# Patient Record
Sex: Female | Born: 2000 | Race: Black or African American | Hispanic: No | Marital: Single | Smoking: Former smoker
Health system: Southern US, Community
[De-identification: ages and names within clinical notes are randomized; demographics above are authoritative.]

## PROBLEM LIST (undated history)

## (undated) ENCOUNTER — Ambulatory Visit (HOSPITAL_COMMUNITY): Discharge status: Absent without leave | Payer: Self-pay

## (undated) DIAGNOSIS — J36 Peritonsillar abscess: Secondary | ICD-10-CM

## (undated) HISTORY — PX: NO PAST SURGERIES: SHX2092

## (undated) HISTORY — PX: TONSILLECTOMY: SUR1361

---
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
## 2019-01-05 ENCOUNTER — Encounter (HOSPITAL_COMMUNITY): Payer: Self-pay | Admitting: Emergency Medicine

## 2019-01-05 ENCOUNTER — Other Ambulatory Visit: Payer: Self-pay

## 2019-01-05 ENCOUNTER — Emergency Department (HOSPITAL_COMMUNITY)
Admission: EM | Admit: 2019-01-05 | Discharge: 2019-01-05 | Disposition: A | Discharge status: Home / Self Care | Payer: Federal, State, Local not specified - PPO | Attending: Emergency Medicine | Admitting: Emergency Medicine

## 2019-01-05 DIAGNOSIS — J039 Acute tonsillitis, unspecified: Secondary | ICD-10-CM

## 2019-01-05 DIAGNOSIS — J029 Acute pharyngitis, unspecified: Secondary | ICD-10-CM | POA: Diagnosis present

## 2019-01-05 LAB — GROUP A STREP BY PCR: Group A Strep by PCR: NOT DETECTED

## 2019-01-05 MED ORDER — PREDNISOLONE SODIUM PHOSPHATE 15 MG/5ML PO SOLN
60.0000 mg | Freq: Once | ORAL | Status: AC
  Administered 2019-01-05: 60 mg via ORAL
  Filled 2019-01-05: qty 4

## 2019-01-05 MED ORDER — AMOXICILLIN 500 MG PO CAPS: 1000.0000 mg | ORAL_CAPSULE | Freq: Three times a day (TID) | ORAL | 0 refills | Status: AC

## 2019-01-05 MED ORDER — LIDOCAINE VISCOUS HCL 2 % MT SOLN
15.0000 mL | Freq: Once | OROMUCOSAL | Status: AC
  Administered 2019-01-05: 22:00:00 15 mL via OROMUCOSAL
  Filled 2019-01-05: qty 15

## 2019-01-05 MED ORDER — LIDOCAINE VISCOUS HCL 2 % MT SOLN: 15.0000 mL | OROMUCOSAL | 0 refills | Status: AC | PRN

## 2019-01-05 MED ORDER — PREDNISONE 10 MG PO TABS: 40.0000 mg | ORAL_TABLET | Freq: Every day | ORAL | 0 refills | Status: AC

## 2019-01-05 MED ORDER — AMOXICILLIN 500 MG PO CAPS
1000.0000 mg | ORAL_CAPSULE | Freq: Once | ORAL | Status: AC
  Administered 2019-01-05: 22:00:00 1000 mg via ORAL
  Filled 2019-01-05: qty 2

## 2019-01-05 MED ORDER — ACETAMINOPHEN 325 MG PO TABS
650.0000 mg | ORAL_TABLET | Freq: Once | ORAL | Status: AC | PRN
  Administered 2019-01-05: 19:00:00 650 mg via ORAL
  Filled 2019-01-05: qty 2

## 2019-01-05 NOTE — Discharge Instructions (Signed)
You are seen today for sore throat.  It is.  She has very swollen tonsils.  You are given some medication for the pain, the swelling and infection.  If you are no better in 2 days please be reevaluated.  If you are worse please come sooner to the emergency department.  Drink plenty of liquids.  Take Tylenol for fever.

## 2019-01-05 NOTE — ED Provider Notes (Signed)
[facility]-EMERGENCY DEPT Provider Note   CSN: [id number] Arrival date & time: 01/05/19  1738     History   Chief Complaint Chief Complaint  Patient presents with  . Sore Throat    HPI Dawn Cannon is a 18 y.o. female.     18 year old female with no past medical history presenting to the emergency department for sore throat which began yesterday.  Reports significant pain in her throat.  She is swallowing liquids and solids but with discomfort.  Denies any nausea, vomiting, chest pain, shortness of breath.  Denies any other URI symptoms.  Has not tried anything for relief.     History reviewed. No pertinent past medical history.  There are no active problems to display for this patient.   History reviewed. No pertinent surgical history.   OB History   No obstetric history on file.      Home Medications    Prior to Admission medications   Not on File    Family History No family history on file.  Social History Social History   Tobacco Use  . Smoking status: Not on file  Substance Use Topics  . Alcohol use: Not on file  . Drug use: Not on file     Allergies   Patient has no known allergies.   Review of Systems Review of Systems  Constitutional: Positive for fever. Negative for chills, diaphoresis, fatigue and unexpected weight change.  HENT: Positive for sore throat and trouble swallowing (pain with swallowing). Negative for congestion, drooling, ear pain, nosebleeds, postnasal drip, rhinorrhea and sinus pain.   Respiratory: Negative for cough and shortness of breath.   Cardiovascular: Negative for chest pain.  Gastrointestinal: Negative for nausea and vomiting.  Genitourinary: Negative for dysuria.  Musculoskeletal: Negative for back pain.  Skin: Negative for rash.  Allergic/Immunologic: Negative for immunocompromised state.  Neurological: Negative for dizziness, light-headedness and headaches.     Physical Exam  Updated Vital Signs BP 140/80 (BP Location: Left Arm)   Pulse 88   Temp (!) 102.3 F (39.1 C) (Oral)   Resp 20   SpO2 100%   Physical Exam Vitals signs and nursing note reviewed.  Constitutional:      Appearance: She is well-developed.  HENT:     Nose: No congestion or rhinorrhea.     Mouth/Throat:     Mouth: Mucous membranes are moist.     Pharynx: Oropharynx is clear. No uvula swelling.     Tonsils: Tonsillar exudate present. No tonsillar abscesses. 3+ on the right. 3+ on the left.  Eyes:     Extraocular Movements:     Right eye: Normal extraocular motion.     Left eye: Normal extraocular motion.     Conjunctiva/sclera: Conjunctivae normal.  Neck:     Musculoskeletal: Normal range of motion and neck supple.     Thyroid: No thyromegaly.  Cardiovascular:     Rate and Rhythm: Normal rate and regular rhythm.  Pulmonary:     Effort: Pulmonary effort is normal.     Breath sounds: Normal breath sounds.  Lymphadenopathy:     Cervical: Cervical adenopathy (Bilateral anterior cervical tenderness to palpation) present.  Skin:    General: Skin is warm.  Neurological:     Mental Status: She is alert.  Psychiatric:        Mood and Affect: Mood normal.      ED Treatments / Results  Labs (all labs ordered are listed, but only abnormal results are  displayed) Labs Reviewed  GROUP A STREP BY PCR    EKG None  Radiology No results found.  Procedures Procedures (including critical care time)  Medications Ordered in ED Medications  lidocaine (XYLOCAINE) 2 % viscous mouth solution 15 mL (has no administration in time range)  prednisoLONE (ORAPRED) 15 MG/5ML solution 60 mg (has no administration in time range)  amoxicillin (AMOXIL) capsule 1,000 mg (has no administration in time range)  acetaminophen (TYLENOL) tablet 650 mg (650 mg Oral Given 01/05/19 1901)     Initial Impression / Assessment and Plan / ED Course  I have reviewed the triage vital signs and the nursing  notes.  Pertinent labs & imaging results that were available during my care of the patient were reviewed by me and considered in my medical decision making (see chart for details).  Clinical Course as of Jan 05 2211  Tue Jan 05, 2019  2141 Patient here with sore throat starting yesterday.  Has significantly swollen tonsils with tonsillar exudate.  No peritonsillar or tonsillar abscess.  Febrile but strep test negative.  Given her symptoms and appearance of her tonsils we will treat her for strep tonsillitis.  Patient given Tylenol, amoxicillin, viscous lidocaine, Orapred.  She was advised on very strict return precautions and that she needs to return to the emergency department if she has any new or worsening symptoms or is not proving after 2 days   [KM]    Clinical Course User Index [KM] Alveria Apley, PA-C       Based on review of vitals, medical screening exam, lab work and/or imaging, there does not appear to be an acute, emergent etiology for the patient's symptoms. Counseled pt on good return precautions and encouraged both PCP and ED follow-up as needed.  Prior to discharge, I also discussed incidental imaging findings with patient in detail and advised appropriate, recommended follow-up in detail.  Clinical Impression: 1. Tonsillitis     Disposition: Discharge  Prior to providing a prescription for a controlled substance, I independently reviewed the patient's recent prescription history on the [place]. The patient had no recent or regular prescriptions and was deemed appropriate for a brief, less than 3 day prescription of narcotic for acute analgesia.  This note was prepared with assistance of Systems analyst. Occasional wrong-word or sound-a-like substitutions may have occurred due to the inherent limitations of voice recognition software.   Final Clinical Impressions(s) / ED Diagnoses   Final diagnoses:   None    ED Discharge Orders    None       Kristine Royal 01/05/19 2227    Lacretia Leigh, MD 01/07/19 1341

## 2019-01-05 NOTE — ED Triage Notes (Signed)
Pt c/o sore throat and trouble speaking due to pain that started yesterday.

## 2019-04-12 ENCOUNTER — Emergency Department (HOSPITAL_COMMUNITY): Payer: Federal, State, Local not specified - PPO

## 2019-04-12 ENCOUNTER — Encounter (HOSPITAL_COMMUNITY): Payer: Self-pay

## 2019-04-12 ENCOUNTER — Emergency Department (HOSPITAL_COMMUNITY)
Admission: EM | Admit: 2019-04-12 | Discharge: 2019-04-12 | Disposition: A | Discharge status: Home / Self Care | Payer: Federal, State, Local not specified - PPO | Attending: Emergency Medicine | Admitting: Emergency Medicine

## 2019-04-12 DIAGNOSIS — J029 Acute pharyngitis, unspecified: Secondary | ICD-10-CM | POA: Diagnosis present

## 2019-04-12 DIAGNOSIS — Z20822 Contact with and (suspected) exposure to covid-19: Secondary | ICD-10-CM | POA: Diagnosis not present

## 2019-04-12 DIAGNOSIS — J36 Peritonsillar abscess: Secondary | ICD-10-CM | POA: Insufficient documentation

## 2019-04-12 DIAGNOSIS — Z79899 Other long term (current) drug therapy: Secondary | ICD-10-CM | POA: Diagnosis not present

## 2019-04-12 LAB — BASIC METABOLIC PANEL
Anion gap: 11 (ref 5–15)
BUN: 9 mg/dL (ref 6–20)
CO2: 23 mmol/L (ref 22–32)
Calcium: 9.4 mg/dL (ref 8.9–10.3)
Chloride: 104 mmol/L (ref 98–111)
Creatinine, Ser: 0.95 mg/dL (ref 0.44–1.00)
GFR calc Af Amer: >60 mL/min (ref >60)
GFR calc non Af Amer: >60 mL/min (ref >60)
Glucose, Bld: 114 mg/dL — ABNORMAL HIGH (ref 70–99)
Potassium: 4 mmol/L (ref 3.5–5.1)
Sodium: 138 mmol/L (ref 135–145)

## 2019-04-12 LAB — CBC WITH DIFFERENTIAL/PLATELET
Abs Immature Granulocytes: 0.04 10*3/uL (ref 0.00–0.07)
Basophils Absolute: 0 10*3/uL (ref 0.0–0.1)
Basophils Relative: 0 %
Eosinophils Absolute: 0 10*3/uL (ref 0.0–0.5)
Eosinophils Relative: 0 %
HCT: 40.1 % (ref 36.0–46.0)
Hemoglobin: 13.6 g/dL (ref 12.0–15.0)
Immature Granulocytes: 0 %
Lymphocytes Relative: 8 %
Lymphs Abs: 0.9 10*3/uL (ref 0.7–4.0)
MCH: 28.5 pg (ref 26.0–34.0)
MCHC: 33.9 g/dL (ref 30.0–36.0)
MCV: 84.1 fL (ref 80.0–100.0)
Monocytes Absolute: 1.2 10*3/uL — ABNORMAL HIGH (ref 0.1–1.0)
Monocytes Relative: 10 %
Neutro Abs: 10 10*3/uL — ABNORMAL HIGH (ref 1.7–7.7)
Neutrophils Relative %: 82 %
Platelets: 231 10*3/uL (ref 150–400)
RBC: 4.77 MIL/uL (ref 3.87–5.11)
RDW: 14.7 % (ref 11.5–15.5)
WBC: 12.2 10*3/uL — ABNORMAL HIGH (ref 4.0–10.5)
nRBC: 0 % (ref 0.0–0.2)

## 2019-04-12 LAB — MONONUCLEOSIS SCREEN: Mono Screen: NEGATIVE

## 2019-04-12 LAB — POC SARS CORONAVIRUS 2 AG -  ED: SARS Coronavirus 2 Ag: NEGATIVE

## 2019-04-12 LAB — I-STAT BETA HCG BLOOD, ED (MC, WL, AP ONLY): I-stat hCG, quantitative: <5 m[IU]/mL (ref <5)

## 2019-04-12 LAB — GROUP A STREP BY PCR: Group A Strep by PCR: NOT DETECTED

## 2019-04-12 MED ORDER — CLINDAMYCIN HCL 300 MG PO CAPS: 300.0000 mg | ORAL_CAPSULE | Freq: Three times a day (TID) | ORAL | 0 refills | Status: AC

## 2019-04-12 MED ORDER — IOHEXOL 300 MG/ML  SOLN
75.0000 mL | Freq: Once | INTRAMUSCULAR | Status: AC | PRN
  Administered 2019-04-12: 20:00:00 75 mL via INTRAVENOUS

## 2019-04-12 MED ORDER — CLINDAMYCIN PHOSPHATE 600 MG/50ML IV SOLN
600.0000 mg | Freq: Once | INTRAVENOUS | Status: AC
  Administered 2019-04-12: 18:00:00 600 mg via INTRAVENOUS
  Filled 2019-04-12: qty 50

## 2019-04-12 MED ORDER — ACETAMINOPHEN 325 MG PO TABS
650.0000 mg | ORAL_TABLET | Freq: Once | ORAL | Status: AC
  Administered 2019-04-12: 23:00:00 650 mg via ORAL

## 2019-04-12 MED ORDER — DEXAMETHASONE SODIUM PHOSPHATE 10 MG/ML IJ SOLN
10.0000 mg | Freq: Once | INTRAMUSCULAR | Status: AC
  Administered 2019-04-12: 18:00:00 10 mg via INTRAVENOUS
  Filled 2019-04-12: qty 1

## 2019-04-12 NOTE — ED Notes (Signed)
Mother updated about POC

## 2019-04-12 NOTE — ED Notes (Signed)
Pt refusing second COVID test

## 2019-04-12 NOTE — Consult Note (Signed)
Reason for Consult: Evaluate patient with right peritonsillar abscess Referring Physician: ED PA  Dawn Cannon is an 19 y.o. female.  HPI: Whose had a persistent sore throat for over a week.  She was seen at [facility] ED about a week ago and was treated with penicillin.  She has had a persistent sore throat that is worse on the right side with pain in the right ear.  CT scan was performed and showed a small 1 to 1/2 cm right peritonsillar abscess.  She apparently had similar symptoms about a month ago. She has received IV clindamycin as well as steroids in the ED. Strep test was negative. Covid test is pending.  History reviewed. No pertinent past medical history.  History reviewed. No pertinent surgical history.  Social History:  has no history on file for tobacco, alcohol, and drug.  Allergies:  Allergies  Allergen Reactions  . Flour Shortness Of Breath  . Watermelon [Citrullus Vulgaris] Swelling, Rash and Other (See Comments)    No breathing impairment (rash/swelling- face and mouth only)    Medications:   Results for orders placed or performed during the hospital encounter of 04/12/19 (from the past 48 hour(s))  CBC with Differential     Status: Abnormal   Collection Time: 04/12/19  5:28 PM  Result Value Ref Range   WBC 12.2 (H) 4.0 - 10.5 K/uL   RBC 4.77 3.87 - 5.11 MIL/uL   Hemoglobin 13.6 12.0 - 15.0 g/dL   HCT 40.1 36.0 - 46.0 %   MCV 84.1 80.0 - 100.0 fL   MCH 28.5 26.0 - 34.0 pg   MCHC 33.9 30.0 - 36.0 g/dL   RDW 14.7 11.5 - 15.5 %   Platelets 231 150 - 400 K/uL   nRBC 0.0 0.0 - 0.2 %   Neutrophils Relative % 82 %   Neutro Abs 10.0 (H) 1.7 - 7.7 K/uL   Lymphocytes Relative 8 %   Lymphs Abs 0.9 0.7 - 4.0 K/uL   Monocytes Relative 10 %   Monocytes Absolute 1.2 (H) 0.1 - 1.0 K/uL   Eosinophils Relative 0 %   Eosinophils Absolute 0.0 0.0 - 0.5 K/uL   Basophils Relative 0 %   Basophils Absolute 0.0 0.0 - 0.1 K/uL   Immature Granulocytes 0 %   Abs Immature  Granulocytes 0.04 0.00 - 0.07 K/uL    Comment: Performed at [facility], [address], [place], [place] [address]  Basic metabolic panel     Status: Abnormal   Collection Time: 04/12/19  5:28 PM  Result Value Ref Range   Sodium 138 135 - 145 mmol/L   Potassium 4.0 3.5 - 5.1 mmol/L   Chloride 104 98 - 111 mmol/L   CO2 23 22 - 32 mmol/L   Glucose, Bld 114 (H) 70 - 99 mg/dL   BUN 9 6 - 20 mg/dL   Creatinine, Ser 0.95 0.44 - 1.00 mg/dL   Calcium 9.4 8.9 - 10.3 mg/dL   GFR calc non Af Amer >60 >60 mL/min   GFR calc Af Amer >60 >60 mL/min   Anion gap 11 5 - 15    Comment: Performed at [facility] [address] [place],  [address]  Group A Strep by PCR     Status: None   Collection Time: 04/12/19  5:41 PM   Specimen: Throat; Sterile Swab  Result Value Ref Range   Group A Strep by PCR NOT DETECTED NOT DETECTED    Comment:  Performed at [facility], [place] [address], [place], [place] [address]  I-Stat Beta hCG blood, ED ([facility], [facility], [facility] only)     Status: None   Collection Time: 04/12/19  6:08 PM  Result Value Ref Range   I-stat hCG, quantitative <5.0 <5 mIU/mL   Comment 3            Comment:   GEST. AGE      CONC.  (mIU/mL)   <=1 WEEK        5 - 50     2 WEEKS       50 - 500     3 WEEKS       100 - 10,000     4 WEEKS     1,000 - 30,000        FEMALE AND NON-PREGNANT FEMALE:     LESS THAN 5 mIU/mL   Mononucleosis screen     Status: None   Collection Time: 04/12/19  6:16 PM  Result Value Ref Range   Mono Screen NEGATIVE NEGATIVE    Comment: Performed at [facility], [place] [address], [place], [place] [address]  POC SARS Coronavirus 2 Ag-ED - Nasal Swab (BD Veritor Kit)     Status: None   Collection Time: 04/12/19  6:28 PM  Result Value Ref Range   SARS Coronavirus 2 Ag NEGATIVE NEGATIVE    Comment: (NOTE) SARS-CoV-2 antigen NOT DETECTED.  Negative results are presumptive.  Negative results do not preclude SARS-CoV-2 infection and should not be used as  the sole basis for treatment or other patient management decisions, including infection  control decisions, particularly in the presence of clinical signs and  symptoms consistent with COVID-19, or in those who have been in contact with the virus.  Negative results must be combined with clinical observations, patient history, and epidemiological information. The expected result is Negative. Fact Sheet for Patients: PodPark.tn Fact Sheet for Healthcare Providers: GiftContent.is This test is not yet approved or cleared by the [place] FDA and  has been authorized for detection and/or diagnosis of SARS-CoV-2 by FDA under an Emergency Use Authorization (EUA).  This EUA will remain in effect (meaning this test can be used) for the duration of  the COVID-19 de claration under Section 564(b)(1) of the Act, 21 U.S.C. section 360bbb-3(b)(1), unless the authorization is terminated or revoked sooner.     CT Soft Tissue Neck W Contrast  Result Date: 04/12/2019 CLINICAL DATA:  Tonsillitis. The patient reports a sensation of her throat closing. Leukocytosis. EXAM: CT NECK WITH CONTRAST TECHNIQUE: Multidetector CT imaging of the neck was performed using the standard protocol following the bolus administration of intravenous contrast. CONTRAST:  50m OMNIPAQUE IOHEXOL 300 MG/ML  SOLN COMPARISON:  None. FINDINGS: Pharynx and larynx: There is asymmetric enlargement of the right palatine tonsil, and there is an associated peritonsillar fluid collection measuring 13 x 8 x 10 mm. Mild submucosal edema extends inferiorly. The airway remains patent. There is no retropharyngeal fluid collection. Salivary glands: No inflammation, mass, or stone. Thyroid: Unremarkable. Lymph nodes: No enlarged or suspicious lymph nodes in the neck. Vascular: Major vascular structures of the neck are patent. Limited intracranial: Unremarkable. Visualized orbits: Unremarkable.  Mastoids and visualized paranasal sinuses: Clear. Skeleton: No acute osseous abnormality or suspicious osseous lesion. Upper chest: Clear lung apices. Other: None. IMPRESSION: Acute tonsillitis with 13 mm right peritonsillar abscess. Electronically Signed   By: ALogan BoresM.D.   On: 04/12/2019 20:12  ROS: Otherwise negative   PE: Patient is alert in moderate pain discomfort on the right side of her right ear. She has no airway problems. Oral examination with headlight reveals right peritonsillar swelling with a soft left peritonsillar area.  She has moderate size 3+ tonsils bilaterally. Procedure: I&D of right peritonsillar abscess. Oropharynx was sprayed with Hurricaine for topical anesthesia. The right peritonsillar area was then injected with 5 cc of 1% Xylocaine with epinephrine for local anesthetic. Using a 18-gauge needle aspiration of the right peritonsillar area was performed in approximately 1 cc of purulence was obtained.  This was then opened up with a 15 blade and hemostat with a minimal further purulence obtained. She tolerated this well with minimal bleeding.  Assessment/Plan: Right peritonsillar abscess.  History of similar symptoms 1 month ago.  Agree with treatment with clindamycin 300 mg 3 times daily for 1 week. She will follow-up in my office next week for recheck and to possibly schedule tonsillectomy in the near future. She can call me if she has any further problems     [phone]  Dawn Cannon 04/12/2019, 10:07 PM

## 2019-04-12 NOTE — ED Notes (Signed)
Discharge instructions discussed with pt. Pt verbalized understanding. Pt stable and ambulatory. No signature pad available. 

## 2019-04-12 NOTE — ED Triage Notes (Signed)
Pt thinks she is having an allergic reaction to something at work, states she feels like her throat is closing. resp unlabored at this time, oxygen 100%.

## 2019-04-12 NOTE — ED Notes (Signed)
Dr. Ezzard Standing at bedside draining tonsil abcess

## 2019-04-12 NOTE — ED Provider Notes (Addendum)
[facility] EMERGENCY DEPARTMENT Provider Note   CSN: [id number] Arrival date & time: 04/12/19  1706     History Chief Complaint  Patient presents with  . Sore Throat    Dawn Cannon is a 19 y.o. female otherwise healthy no daily medication use presents today for sore throat.  Of note triage documentation shows concern for allergic reaction from something she ate at work and feels like her throat is closing.  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  She reports that this has persistently gotten worse over the last week.  She describes a bilateral scratchy burning sensation constant worsened with swallowing and without alleviating factors, she reports that she feels like her throat is swollen, she denies radiation of her pain and currently describes it as moderate in intensity.  She reports that she was seen at [facility] earlier this week and received an injection of intramuscular penicillin, she is unsure why she received this injection, she reports that it did not alleviate her symptoms.  She denies any history of fever/chills, headache, vision changes, drooling, voice change, chest pain/shortness of breath, cough, wheezing, abdominal pain, nausea/vomiting, diarrhea, rash or any additional concerns.  She denies history of previous allergies.  HPI     History reviewed. No pertinent past medical history.  There are no problems to display for this patient.   History reviewed. No pertinent surgical history.   OB History   No obstetric history on file.     No family history on file.  Social History   Tobacco Use  . Smoking status: Not on file  Substance Use Topics  . Alcohol use: Not on file  . Drug use: Not on file    Home Medications Prior to Admission medications     Medication Sig Start Date End Date Taking? Authorizing Provider  penicillin v potassium (VEETID) 250 MG tablet Take 500 mg by mouth 2 (two) times daily. FOR 10 DAYS 04/11/19  Yes [provider]  clindamycin (CLEOCIN) 300 MG capsule Take 1 capsule (300 mg total) by mouth 3 (three) times daily for 7 days. 04/12/19 04/19/19  Harlene Salts A, PA-C    Allergies    Flour and Watermelon [citrullus vulgaris]  Review of Systems   Review of Systems Ten systems are reviewed and are negative for acute change except as noted in the HPI  Physical Exam Updated Vital Signs BP 121/72   Pulse 68   Temp 98.1 F (36.7 C) (Oral)   Resp 16   SpO2 99%   Physical Exam Constitutional:      General: She is not in acute distress.    Appearance: Normal appearance. She is well-developed. She is not ill-appearing or diaphoretic.  HENT:     Head: Normocephalic and atraumatic.     Jaw: There is normal jaw occlusion. No trismus.     Right Ear: Tympanic membrane and external ear normal.     Left Ear: Tympanic membrane and external ear normal.     Nose: Nose normal.     Mouth/Throat:     Mouth: Mucous membranes are moist.     Pharynx: Uvula midline.     Tonsils: 3+ on the right. 2+ on the left.     Comments: The patient has normal phonation and is in  control of secretions. No stridor.  Midline uvula without edema. Soft palate rises symmetrically.  Bilateral tonsillar swelling right greater than left with mild erythema no exudates. Tongue protrusion is normal, floor of mouth is soft. No trismus. No creptius on neck palpation. No gingival erythema or fluctuance noted. Mucus membranes moist. No pallor noted. Eyes:     General: Vision grossly intact. Gaze aligned appropriately.     Extraocular Movements: Extraocular movements intact.     Conjunctiva/sclera: Conjunctivae normal.     Pupils: Pupils are equal, round, and reactive to light.  Neck:     Trachea: Trachea and phonation normal. No tracheal  tenderness or tracheal deviation.  Pulmonary:     Effort: Pulmonary effort is normal. No respiratory distress.  Abdominal:     General: There is no distension.     Palpations: Abdomen is soft.     Tenderness: There is no abdominal tenderness. There is no guarding or rebound.  Musculoskeletal:        General: Normal range of motion.     Cervical back: Full passive range of motion without pain, normal range of motion and neck supple.  Skin:    General: Skin is warm and dry.     Findings: No rash.  Neurological:     Mental Status: She is alert.     GCS: GCS eye subscore is 4. GCS verbal subscore is 5. GCS motor subscore is 6.     Comments: Speech is clear and goal oriented, follows commands Major Cranial nerves without deficit, no facial droop Moves extremities without ataxia, coordination intact  Psychiatric:        Behavior: Behavior normal.     ED Results / Procedures / Treatments   Labs (all labs ordered are listed, but only abnormal results are displayed) Labs Reviewed  CBC WITH DIFFERENTIAL/PLATELET - Abnormal; Notable for the following components:      Result Value   WBC 12.2 (*)    Neutro Abs 10.0 (*)    Monocytes Absolute 1.2 (*)    All other components within normal limits  BASIC METABOLIC PANEL - Abnormal; Notable for the following components:   Glucose, Bld 114 (*)    All other components within normal limits  GROUP A STREP BY PCR  SARS CORONAVIRUS 2 (TAT 6-24 HRS)  MONONUCLEOSIS SCREEN  I-STAT BETA HCG BLOOD, ED ([facility], [facility], [facility] ONLY)  POC SARS CORONAVIRUS 2 AG -  ED    EKG EKG Interpretation  Date/Time:  Monday April 12 2019 17:12:32 EST Ventricular Rate:  120 PR Interval:  126 QRS Duration: 82 QT Interval:  310 QTC Calculation: 438 R Axis:   98 Text Interpretation: Sinus tachycardia Rightward axis ST & T wave abnormality, consider inferior ischemia Abnormal ECG No previous ECGs available Confirmed by Vanetta Mulders [phone]) on 04/12/2019 8:09:03  PM   Radiology CT Soft Tissue Neck W Contrast  Result Date: 04/12/2019 CLINICAL DATA:  Tonsillitis. The patient reports a sensation of her throat closing. Leukocytosis. EXAM: CT NECK WITH CONTRAST TECHNIQUE: Multidetector CT imaging of the neck was performed using the standard protocol following the bolus administration of intravenous contrast. CONTRAST:  74mL OMNIPAQUE IOHEXOL 300 MG/ML  SOLN COMPARISON:  None. FINDINGS: Pharynx and larynx: There is asymmetric enlargement of the right palatine tonsil, and there is an associated peritonsillar fluid collection measuring 13 x 8 x 10 mm. Mild submucosal edema extends inferiorly. The airway remains patent. There is no retropharyngeal fluid collection. Salivary glands: No inflammation, mass, or stone.  Thyroid: Unremarkable. Lymph nodes: No enlarged or suspicious lymph nodes in the neck. Vascular: Major vascular structures of the neck are patent. Limited intracranial: Unremarkable. Visualized orbits: Unremarkable. Mastoids and visualized paranasal sinuses: Clear. Skeleton: No acute osseous abnormality or suspicious osseous lesion. Upper chest: Clear lung apices. Other: None. IMPRESSION: Acute tonsillitis with 13 mm right peritonsillar abscess. Electronically Signed   By: Logan Bores M.D.   On: 04/12/2019 20:12    Procedures Procedures (including critical care time)  Medications Ordered in ED Medications  dexamethasone (DECADRON) injection 10 mg (10 mg Intravenous Given 04/12/19 1814)  clindamycin (CLEOCIN) IVPB 600 mg (0 mg Intravenous Stopped 04/12/19 1922)  iohexol (OMNIPAQUE) 300 MG/ML solution 75 mL (75 mLs Intravenous Contrast Given 04/12/19 1954)    ED Course  I have reviewed the triage vital signs and the nursing notes.  Pertinent labs & imaging results that were available during my care of the patient were reviewed by me and considered in my medical decision making (see chart for details).  Clinical Course as of Apr 11 2244  Mon Apr 12, 2019   2048 Dr. Lucia Gaskins   [BM]  2238 Clindamycin 3 times daily x1 week    [BM]    Clinical Course User Index [BM] Gari Crown   MDM Rules/Calculators/A&P                     19 year old female checked in today for concern of throat swelling and possible allergic reaction ongoing for 1 week after being exposed to flour.  Further history and examination revealed the patient has tonsillitis today, it appears she was treated at [facility] for the same a few days ago and treated with intramuscular penicillin, I am unable to access their charts through EMR today.  On examination patient is comfortable well-appearing and in no acute distress.  She has markedly swollen tonsils right greater than left with mild erythema and without exudate.  Her airway is patent, she is controlling her secretions and reports her voice is normal.  She has mild bilateral cervical lymphadenopathy.  Heart regular rate and rhythm without murmur, lungs clear bilaterally abdomen is soft nontender without peritoneal signs there is no evidence of rash.  Suspect patient symptoms today secondary to tonsillitis, no evidence of allergic reaction.  There is concern as patient has ongoing symptoms despite intramuscular penicillin treatment, concern for possible development of peritonsillar abscess as right tonsil does appear larger compared to left.  Will give patient IV clindamycin, Decadron and obtain basic blood work and CT soft tissue neck. - Strep test negative Mono screen negative Beta-hCG negative Rapid Covid negative CBC with mild leukocytosis of 12.2 BMP glucose 114 otherwise within normal limits Patient has received Decadron and clindamycin.  On reassessment she is resting comfortably in bed no acute distress reports she feels "a lot better".  She is still waiting her CT scan at this time.  She remains stable with an intact airway. - CT soft tissue neck:    IMPRESSION:  Acute tonsillitis with 13 mm right  peritonsillar abscess  - 8:48 PM: Consult called to ENT specialist Dr. Lucia Gaskins who is on way to evaluate patient.  Case discussed with Dr. Rogene Houston. - Patient was seen and evaluated by ENT specialist Dr. Lucia Gaskins who performed incision and drainage of the right peritonsillar abscess.  Please see his documentation for details.  He recommends to discharge patient with clindamycin 300 mg 3 times daily for 1 week and she is to  follow-up in his office for recheck next week and plans for possible tonsillectomy. - On reevaluation patient resting comfortably in bed no acute distress states understanding of care plan and is agreeable for discharge.  She is aware that she is a pending COVID-19 test and she will follow-up on her MyChart account the next few days and discuss results with your primary care provider.  At this time there does not appear to be any evidence of an acute emergency medical condition and the patient appears stable for discharge with appropriate outpatient follow up. Diagnosis was discussed with patient who verbalizes understanding of care plan and is agreeable to discharge. I have discussed return precautions with patient who verbalizes understanding of return precautions. Patient encouraged to follow-up with their PCP and ENT. All questions answered.  Dawn Cannon was evaluated in Emergency Department on 04/12/2019 for the symptoms described in the history of present illness. She was evaluated in the context of the global COVID-19 pandemic, which necessitated consideration that the patient might be at risk for infection with the SARS-CoV-2 virus that causes COVID-19. Institutional protocols and algorithms that pertain to the evaluation of patients at risk for COVID-19 are in a state of rapid change based on information released by regulatory bodies including the CDC and federal and state organizations. These policies and algorithms were followed during the patient's care in the ED.  Note:  Portions of this report may have been transcribed using voice recognition software. Every effort was made to ensure accuracy; however, inadvertent computerized transcription errors may still be present. Final Clinical Impression(s) / ED Diagnoses Final diagnoses:  Peritonsillar abscess    Rx / DC Orders ED Discharge Orders         Ordered    clindamycin (CLEOCIN) 300 MG capsule  3 times daily     04/12/19 2245           Elizabeth Palau 04/12/19 2246    Bill Salinas, PA-C 04/12/19 2246    Vanetta Mulders, MD 04/17/19 [phone]

## 2019-04-12 NOTE — Discharge Instructions (Signed)
You have been diagnosed today with Peritonsillar Abscess.  At this time there does not appear to be the presence of an emergent medical condition, however there is always the potential for conditions to change. Please read and follow the below instructions.  Please return to the Emergency Department immediately for any new or worsening symptoms or if your symptoms return. Please be sure to follow up with your Primary Care Provider within one week regarding your visit today; please call their office to schedule an appointment even if you are feeling better for a follow-up visit. Please take the antibiotic clindamycin as prescribed, 1 pill 3 times daily for the next 1 week.  Please call Dr. Allene Pyo office on your discharge paperwork to schedule a follow-up appointment. Your Covid test is currently in process, please check for the results on your MyChart account in the next 1-2 days and discuss the results with your primary care provider at your follow-up visit this week.  Get help right away if you: Have trouble talking. Have trouble breathing. Breathing is easier when you lean forward. Cough up blood. Throw up (vomit) blood. Have very bad throat pain and it does not get better with medicine. You have any new/concerning or worsening of symptoms  Please read the additional information packets attached to your discharge summary.  Do not take your medicine if  develop an itchy rash, swelling in your mouth or lips, or difficulty breathing; call 911 and seek immediate emergency medical attention if this occurs.  Note: Portions of this text may have been transcribed using voice recognition software. Every effort was made to ensure accuracy; however, inadvertent computerized transcription errors may still be present.

## 2019-04-12 NOTE — ED Notes (Signed)
Pt c/o pain in throat radiating to ear. Apolinar Junes, [place] notified. Placing orders.

## 2019-04-13 ENCOUNTER — Other Ambulatory Visit (INDEPENDENT_AMBULATORY_CARE_PROVIDER_SITE_OTHER): Payer: Self-pay

## 2019-04-13 ENCOUNTER — Encounter (HOSPITAL_COMMUNITY): Payer: Self-pay

## 2019-04-19 ENCOUNTER — Ambulatory Visit (INDEPENDENT_AMBULATORY_CARE_PROVIDER_SITE_OTHER): Payer: Federal, State, Local not specified - PPO | Admitting: Otolaryngology

## 2019-04-19 ENCOUNTER — Other Ambulatory Visit: Payer: Self-pay

## 2019-04-19 ENCOUNTER — Encounter (INDEPENDENT_AMBULATORY_CARE_PROVIDER_SITE_OTHER): Payer: Self-pay | Admitting: Otolaryngology

## 2019-04-19 VITALS — Temp 97.9°F

## 2019-04-19 DIAGNOSIS — J36 Peritonsillar abscess: Secondary | ICD-10-CM

## 2019-04-19 DIAGNOSIS — Z4889 Encounter for other specified surgical aftercare: Secondary | ICD-10-CM

## 2019-04-19 NOTE — Progress Notes (Signed)
HPI: Dawn Cannon is a 19 y.o. female who returns today for evaluation of recurrent peritonsillar abscess.  I performed I&D of a right peritonsillar abscess last week in the ED.  She returns today for follow-up and to discuss possible tonsillectomy.  She is doing much better today.  She presents with her mother..  No past medical history on file. No past surgical history on file. Social History   Socioeconomic History  . Marital status: Single    Spouse name: Not on file  . Number of children: Not on file  . Years of education: Not on file  . Highest education level: Not on file  Occupational History  . Not on file  Tobacco Use  . Smoking status: Former Research scientist (life sciences)  . Smokeless tobacco: Never Used  Substance and Sexual Activity  . Alcohol use: Not on file  . Drug use: Not on file  . Sexual activity: Not on file  Other Topics Concern  . Not on file  Social History Narrative  . Not on file   Social Determinants of Health   Financial Resource Strain:   . Difficulty of Paying Living Expenses: Not on file  Food Insecurity:   . Worried About Charity fundraiser in the Last Year: Not on file  . Ran Out of Food in the Last Year: Not on file  Transportation Needs:   . Lack of Transportation (Medical): Not on file  . Lack of Transportation (Non-Medical): Not on file  Physical Activity:   . Days of Exercise per Week: Not on file  . Minutes of Exercise per Session: Not on file  Stress:   . Feeling of Stress : Not on file  Social Connections:   . Frequency of Communication with Friends and Family: Not on file  . Frequency of Social Gatherings with Friends and Family: Not on file  . Attends Religious Services: Not on file  . Active Member of Clubs or Organizations: Not on file  . Attends Archivist Meetings: Not on file  . Marital Status: Not on file   No family history on file. Allergies  Allergen Reactions  . Flour Shortness Of Breath  . Latex Itching and Rash  .  Watermelon [Citrullus Vulgaris] Swelling, Rash and Other (See Comments)    No breathing impairment (rash/swelling- face and mouth only)   Prior to Admission medications   Medication Sig Start Date End Date Taking? Authorizing Provider  clindamycin (CLEOCIN) 300 MG capsule Take 1 capsule (300 mg total) by mouth 3 (three) times daily for 7 days. 04/12/19 04/19/19 Yes Nuala Alpha A, PA-C  penicillin v potassium (VEETID) 250 MG tablet Take 500 mg by mouth 2 (two) times daily. FOR 10 DAYS 04/11/19  Yes [provider]     Positive ROS: Otherwise healthy  All other systems have been reviewed and were otherwise negative with the exception of those mentioned in the HPI and as above.  Physical Exam: Constitutional: Alert, well-appearing, no acute distress Ears: External ears without lesions or tenderness. Ear canals are clear bilaterally with intact, clear TMs.  Nasal: External nose without lesions. Septum midline.. Clear nasal passages Oral: Lips and gums without lesions. Tongue and palate mucosa without lesions. Posterior oropharynx clear.  Tonsils 2+ bilaterally with no exudate no peritonsillar swelling. Neck: No palpable adenopathy or masses Respiratory: Breathing comfortably.  Lungs clear to auscultation Cardiac exam: Regular rate and rhythm without murmur Skin: No facial/neck lesions or rash noted.  Procedures  Assessment: Recurrent right  peritonsillar abscess  Plan: Discussed tonsillectomy with the patient and we will plan on scheduling this in the next week or 2.  Reviewed with her concerning the morbidity associated with tonsillectomy and that she will have to be out of work and school for 10 to 14 days.   Narda Bonds, MD

## 2019-04-23 ENCOUNTER — Telehealth (INDEPENDENT_AMBULATORY_CARE_PROVIDER_SITE_OTHER): Payer: Self-pay

## 2019-04-23 NOTE — Addendum Note (Signed)
Addended by: Dillard Cannon E on: 04/23/2019 01:13 PM   Modules accepted: Level of Service

## 2019-04-26 ENCOUNTER — Other Ambulatory Visit: Payer: Self-pay

## 2019-04-26 ENCOUNTER — Telehealth (INDEPENDENT_AMBULATORY_CARE_PROVIDER_SITE_OTHER): Payer: Self-pay

## 2019-04-26 ENCOUNTER — Encounter (HOSPITAL_BASED_OUTPATIENT_CLINIC_OR_DEPARTMENT_OTHER): Payer: Self-pay | Admitting: Otolaryngology

## 2019-04-27 ENCOUNTER — Other Ambulatory Visit (HOSPITAL_COMMUNITY): Admission: RE | Admit: 2019-04-27 | Payer: Federal, State, Local not specified - PPO | Source: Ambulatory Visit

## 2019-04-28 ENCOUNTER — Ambulatory Visit (INDEPENDENT_AMBULATORY_CARE_PROVIDER_SITE_OTHER): Payer: Self-pay | Admitting: Otolaryngology

## 2019-04-28 ENCOUNTER — Ambulatory Visit: Payer: Federal, State, Local not specified - PPO | Attending: Internal Medicine

## 2019-04-28 DIAGNOSIS — Z20822 Contact with and (suspected) exposure to covid-19: Secondary | ICD-10-CM

## 2019-04-28 DIAGNOSIS — J36 Peritonsillar abscess: Secondary | ICD-10-CM

## 2019-04-28 NOTE — H&P (Signed)
PREOPERATIVE H&P  Chief Complaint: History of recurrent peritonsillar abscess  HPI: Dawn Cannon is a 19 y.o. female who presents for evaluation of recurrent peritonsillar abscess.  Patient was recently seen at [facility] ED for drainage of a recurrent peritonsillar abscess.  She is taken to the operating room at this time for tonsillectomy.  Past Medical History:  Diagnosis Date  . Abscess of tonsil    Past Surgical History:  Procedure Laterality Date  . NO PAST SURGERIES     Social History   Socioeconomic History  . Marital status: Single    Spouse name: Not on file  . Number of children: Not on file  . Years of education: Not on file  . Highest education level: Not on file  Occupational History  . Not on file  Tobacco Use  . Smoking status: Former Games developer  . Smokeless tobacco: Never Used  Substance and Sexual Activity  . Alcohol use: Never  . Drug use: Yes    Types: Marijuana  . Sexual activity: Not on file  Other Topics Concern  . Not on file  Social History Narrative  . Not on file   Social Determinants of Health   Financial Resource Strain:   . Difficulty of Paying Living Expenses: Not on file  Food Insecurity:   . Worried About Programme researcher, broadcasting/film/video in the Last Year: Not on file  . Ran Out of Food in the Last Year: Not on file  Transportation Needs:   . Lack of Transportation (Medical): Not on file  . Lack of Transportation (Non-Medical): Not on file  Physical Activity:   . Days of Exercise per Week: Not on file  . Minutes of Exercise per Session: Not on file  Stress:   . Feeling of Stress : Not on file  Social Connections:   . Frequency of Communication with Friends and Family: Not on file  . Frequency of Social Gatherings with Friends and Family: Not on file  . Attends Religious Services: Not on file  . Active Member of Clubs or Organizations: Not on file  . Attends Banker Meetings: Not on file  . Marital Status: Not on file   No family  history on file. Allergies  Allergen Reactions  . Flour Shortness Of Breath  . Latex Itching and Rash  . Watermelon [Citrullus Vulgaris] Swelling, Rash and Other (See Comments)    No breathing impairment (rash/swelling- face and mouth only)   Prior to Admission medications   Not on File     Positive ROS: Otherwise negative  All other systems have been reviewed and were otherwise negative with the exception of those mentioned in the HPI and as above.  Physical Exam: There were no vitals filed for this visit.  General: Alert, no acute distress Oral: Normal oral mucosa and tonsils 2+ bilaterally Nasal: Clear nasal passages Neck: No palpable adenopathy or thyroid nodules Ear: Ear canal is clear with normal appearing TMs Cardiovascular: Regular rate and rhythm, no murmur.  Respiratory: Clear to auscultation Neurologic: Alert and oriented x 3   Assessment/Plan: Recurrent peritonsillar abscess  Plan for tonsillectomy   Dillard Cannon, MD 04/28/2019 4:34 PM

## 2019-04-28 NOTE — H&P (View-Only) (Signed)
PREOPERATIVE H&P  Chief Complaint: History of recurrent peritonsillar abscess  HPI: Dawn Cannon is a 18 y.o. female who presents for evaluation of recurrent peritonsillar abscess.  Patient was recently seen at [place] for drainage of a recurrent peritonsillar abscess.  She is taken to the operating room at this time for tonsillectomy.  Past Medical History:  Diagnosis Date  . Abscess of tonsil    Past Surgical History:  Procedure Laterality Date  . NO PAST SURGERIES     Social History   Socioeconomic History  . Marital status: Single    Spouse name: Not on file  . Number of children: Not on file  . Years of education: Not on file  . Highest education level: Not on file  Occupational History  . Not on file  Tobacco Use  . Smoking status: Former Smoker  . Smokeless tobacco: Never Used  Substance and Sexual Activity  . Alcohol use: Never  . Drug use: Yes    Types: Marijuana  . Sexual activity: Not on file  Other Topics Concern  . Not on file  Social History Narrative  . Not on file   Social Determinants of Health   Financial Resource Strain:   . Difficulty of Paying Living Expenses: Not on file  Food Insecurity:   . Worried About Running Out of Food in the Last Year: Not on file  . Ran Out of Food in the Last Year: Not on file  Transportation Needs:   . Lack of Transportation (Medical): Not on file  . Lack of Transportation (Non-Medical): Not on file  Physical Activity:   . Days of Exercise per Week: Not on file  . Minutes of Exercise per Session: Not on file  Stress:   . Feeling of Stress : Not on file  Social Connections:   . Frequency of Communication with Friends and Family: Not on file  . Frequency of Social Gatherings with Friends and Family: Not on file  . Attends Religious Services: Not on file  . Active Member of Clubs or Organizations: Not on file  . Attends Club or Organization Meetings: Not on file  . Marital Status: Not on file   No family  history on file. Allergies  Allergen Reactions  . Flour Shortness Of Breath  . Latex Itching and Rash  . Watermelon [Citrullus Vulgaris] Swelling, Rash and Other (See Comments)    No breathing impairment (rash/swelling- face and mouth only)   Prior to Admission medications   Not on File     Positive ROS: Otherwise negative  All other systems have been reviewed and were otherwise negative with the exception of those mentioned in the HPI and as above.  Physical Exam: There were no vitals filed for this visit.  General: Alert, no acute distress Oral: Normal oral mucosa and tonsils 2+ bilaterally Nasal: Clear nasal passages Neck: No palpable adenopathy or thyroid nodules Ear: Ear canal is clear with normal appearing TMs Cardiovascular: Regular rate and rhythm, no murmur.  Respiratory: Clear to auscultation Neurologic: Alert and oriented x 3   Assessment/Plan: Recurrent peritonsillar abscess  Plan for tonsillectomy   Norberto Wishon, MD 04/28/2019 4:34 PM  

## 2019-04-29 LAB — NOVEL CORONAVIRUS, NAA: SARS-CoV-2, NAA: NOT DETECTED

## 2019-04-30 ENCOUNTER — Encounter (HOSPITAL_BASED_OUTPATIENT_CLINIC_OR_DEPARTMENT_OTHER)
Admission: RE | Disposition: A | Discharge status: Home / Self Care | Payer: Self-pay | Source: Home / Self Care | Attending: Otolaryngology

## 2019-04-30 ENCOUNTER — Other Ambulatory Visit: Payer: Self-pay

## 2019-04-30 ENCOUNTER — Ambulatory Visit (HOSPITAL_BASED_OUTPATIENT_CLINIC_OR_DEPARTMENT_OTHER)
Admission: RE | Admit: 2019-04-30 | Discharge: 2019-04-30 | Disposition: A | Discharge status: Home / Self Care | Payer: Federal, State, Local not specified - PPO | Attending: Otolaryngology | Admitting: Otolaryngology

## 2019-04-30 ENCOUNTER — Encounter (HOSPITAL_BASED_OUTPATIENT_CLINIC_OR_DEPARTMENT_OTHER): Payer: Self-pay | Admitting: Otolaryngology

## 2019-04-30 ENCOUNTER — Ambulatory Visit (HOSPITAL_BASED_OUTPATIENT_CLINIC_OR_DEPARTMENT_OTHER): Payer: Federal, State, Local not specified - PPO | Admitting: Certified Registered Nurse Anesthetist

## 2019-04-30 DIAGNOSIS — J36 Peritonsillar abscess: Secondary | ICD-10-CM | POA: Diagnosis not present

## 2019-04-30 DIAGNOSIS — Z87891 Personal history of nicotine dependence: Secondary | ICD-10-CM | POA: Insufficient documentation

## 2019-04-30 HISTORY — PX: TONSILLECTOMY: SHX5217

## 2019-04-30 HISTORY — DX: Peritonsillar abscess: J36

## 2019-04-30 LAB — POCT PREGNANCY, URINE: Preg Test, Ur: NEGATIVE

## 2019-04-30 SURGERY — TONSILLECTOMY
Anesthesia: General | Site: Mouth

## 2019-04-30 MED ORDER — DEXAMETHASONE SODIUM PHOSPHATE 10 MG/ML IJ SOLN
INTRAMUSCULAR | Status: DC | PRN
  Administered 2019-04-30: 10 mg via INTRAVENOUS

## 2019-04-30 MED ORDER — LIDOCAINE HCL (CARDIAC) PF 100 MG/5ML IV SOSY
PREFILLED_SYRINGE | INTRAVENOUS | Status: DC | PRN
  Administered 2019-04-30: 60 mg via INTRAVENOUS

## 2019-04-30 MED ORDER — AMOXICILLIN 250 MG/5ML PO SUSR: 500.0000 mg | Freq: Two times a day (BID) | ORAL | 0 refills | Status: DC

## 2019-04-30 MED ORDER — SUGAMMADEX SODIUM 200 MG/2ML IV SOLN
INTRAVENOUS | Status: DC | PRN
  Administered 2019-04-30: 200 mg via INTRAVENOUS

## 2019-04-30 MED ORDER — LIDOCAINE 2% (20 MG/ML) 5 ML SYRINGE
INTRAMUSCULAR | Status: AC
  Filled 2019-04-30: qty 5

## 2019-04-30 MED ORDER — PROPOFOL 10 MG/ML IV BOLUS
INTRAVENOUS | Status: AC
  Filled 2019-04-30: qty 20

## 2019-04-30 MED ORDER — SUCCINYLCHOLINE CHLORIDE 200 MG/10ML IV SOSY
PREFILLED_SYRINGE | INTRAVENOUS | Status: DC | PRN
  Administered 2019-04-30: 120 mg via INTRAVENOUS

## 2019-04-30 MED ORDER — ROCURONIUM BROMIDE 100 MG/10ML IV SOLN
INTRAVENOUS | Status: DC | PRN
  Administered 2019-04-30: 3 mg via INTRAVENOUS

## 2019-04-30 MED ORDER — MIDAZOLAM HCL 5 MG/5ML IJ SOLN
INTRAMUSCULAR | Status: DC | PRN
  Administered 2019-04-30: 2 mg via INTRAVENOUS

## 2019-04-30 MED ORDER — ACETAMINOPHEN 10 MG/ML IV SOLN
INTRAVENOUS | Status: DC | PRN
  Administered 2019-04-30: 1000 mg via INTRAVENOUS

## 2019-04-30 MED ORDER — FENTANYL CITRATE (PF) 100 MCG/2ML IJ SOLN
INTRAMUSCULAR | Status: DC | PRN
  Administered 2019-04-30 (×2): 50 ug via INTRAVENOUS

## 2019-04-30 MED ORDER — CEFAZOLIN SODIUM-DEXTROSE 2-4 GM/100ML-% IV SOLN
INTRAVENOUS | Status: AC
  Filled 2019-04-30: qty 100

## 2019-04-30 MED ORDER — SUCCINYLCHOLINE CHLORIDE 200 MG/10ML IV SOSY
PREFILLED_SYRINGE | INTRAVENOUS | Status: AC
  Filled 2019-04-30: qty 10

## 2019-04-30 MED ORDER — ACETAMINOPHEN 10 MG/ML IV SOLN
INTRAVENOUS | Status: AC
  Filled 2019-04-30: qty 100

## 2019-04-30 MED ORDER — PROPOFOL 10 MG/ML IV BOLUS
INTRAVENOUS | Status: DC | PRN
  Administered 2019-04-30: 160 mg via INTRAVENOUS

## 2019-04-30 MED ORDER — MIDAZOLAM HCL 2 MG/2ML IJ SOLN
INTRAMUSCULAR | Status: AC
  Filled 2019-04-30: qty 2

## 2019-04-30 MED ORDER — LACTATED RINGERS IV SOLN: INTRAVENOUS | Status: DC

## 2019-04-30 MED ORDER — ONDANSETRON HCL 4 MG/2ML IJ SOLN
INTRAMUSCULAR | Status: DC | PRN
  Administered 2019-04-30: 4 mg via INTRAVENOUS

## 2019-04-30 MED ORDER — HYDROCODONE-ACETAMINOPHEN 7.5-325 MG/15ML PO SOLN: 10.0000 mL | Freq: Four times a day (QID) | ORAL | 0 refills | Status: AC | PRN

## 2019-04-30 MED ORDER — FENTANYL CITRATE (PF) 100 MCG/2ML IJ SOLN
INTRAMUSCULAR | Status: AC
  Filled 2019-04-30: qty 2

## 2019-04-30 MED ORDER — CEFAZOLIN SODIUM-DEXTROSE 2-4 GM/100ML-% IV SOLN
2.0000 g | INTRAVENOUS | Status: AC
  Administered 2019-04-30: 11:00:00 2 g via INTRAVENOUS

## 2019-04-30 SURGICAL SUPPLY — 30 items
BNDG COHESIVE 2X5 TAN STRL LF (GAUZE/BANDAGES/DRESSINGS) IMPLANT
CANISTER SUCT 1200ML W/VALVE (MISCELLANEOUS) ×2 IMPLANT
CATH ROBINSON RED A/P 12FR (CATHETERS) IMPLANT
COAGULATOR SUCT SWTCH 10FR 6 (ELECTROSURGICAL) IMPLANT
COVER BACK TABLE 60X90IN (DRAPES) ×2 IMPLANT
COVER MAYO STAND STRL (DRAPES) ×2 IMPLANT
COVER WAND RF STERILE (DRAPES) IMPLANT
ELECT COATED BLADE 2.86 ST (ELECTRODE) ×2 IMPLANT
ELECT REM PT RETURN 9FT ADLT (ELECTROSURGICAL)
ELECT REM PT RETURN 9FT PED (ELECTROSURGICAL)
ELECTRODE REM PT RETRN 9FT PED (ELECTROSURGICAL) IMPLANT
ELECTRODE REM PT RTRN 9FT ADLT (ELECTROSURGICAL) IMPLANT
GAUZE SPONGE 4X4 12PLY STRL LF (GAUZE/BANDAGES/DRESSINGS) ×2 IMPLANT
GLOVE BIOGEL PI IND STRL 7.0 (GLOVE) IMPLANT
GLOVE BIOGEL PI INDICATOR 7.0 (GLOVE) ×1
GLOVE SS BIOGEL STRL SZ 7.5 (GLOVE) ×1 IMPLANT
GLOVE SUPERSENSE BIOGEL SZ 7.5 (GLOVE) ×1
GLOVE SURG SS PI 7.0 STRL IVOR (GLOVE) ×1 IMPLANT
GOWN STRL REUS W/ TWL LRG LVL3 (GOWN DISPOSABLE) ×1 IMPLANT
GOWN STRL REUS W/TWL LRG LVL3 (GOWN DISPOSABLE) ×2
MARKER SKIN DUAL TIP RULER LAB (MISCELLANEOUS) IMPLANT
NS IRRIG 1000ML POUR BTL (IV SOLUTION) ×2 IMPLANT
PENCIL FOOT CONTROL (ELECTRODE) ×2 IMPLANT
SHEET MEDIUM DRAPE 40X70 STRL (DRAPES) ×2 IMPLANT
SOLUTION BUTLER CLEAR DIP (MISCELLANEOUS) IMPLANT
SPONGE TONSIL TAPE 1 RFD (DISPOSABLE) IMPLANT
SPONGE TONSIL TAPE 1.25 RFD (DISPOSABLE) IMPLANT
SYR BULB 3OZ (MISCELLANEOUS) ×2 IMPLANT
TOWEL GREEN STERILE FF (TOWEL DISPOSABLE) ×2 IMPLANT
TUBE CONNECTING 20X1/4 (TUBING) ×2 IMPLANT

## 2019-04-30 NOTE — Transfer of Care (Signed)
Immediate Anesthesia Transfer of Care Note  Patient: Dawn Cannon  Procedure(s) Performed: TONSILLECTOMY (N/A Mouth)  Patient Location: PACU  Anesthesia Type:General  Level of Consciousness: awake and patient cooperative  Airway & Oxygen Therapy: Patient Spontanous Breathing and Patient connected to face mask oxygen  Post-op Assessment: Report given to RN and Post -op Vital signs reviewed and stable  Post vital signs: Reviewed and stable  Last Vitals:  Vitals Value Taken Time  BP 129/94 04/30/19 1207  Temp    Pulse 78 04/30/19 1209  Resp 14 04/30/19 1209  SpO2 100 % 04/30/19 1209  Vitals shown include unvalidated device data.  Last Pain:  Vitals:   04/30/19 1100  TempSrc: Oral  PainSc: 0-No pain      Patients Stated Pain Goal: 3 (04/30/19 1100)  Complications: No apparent anesthesia complications

## 2019-04-30 NOTE — Anesthesia Procedure Notes (Signed)
Procedure Name: Intubation Date/Time: 04/30/2019 11:27 AM Performed by: [place] Karyme Mcconathy C, CRNA Pre-anesthesia Checklist: Patient identified, Emergency Drugs available, Suction available and Patient being monitored Patient Re-evaluated:Patient Re-evaluated prior to induction Oxygen Delivery Method: Circle system utilized Preoxygenation: Pre-oxygenation with 100% oxygen Induction Type: IV induction Ventilation: Mask ventilation without difficulty Laryngoscope Size: Mac and 3 Grade View: Grade I Tube type: Oral Tube size: 7.0 mm Number of attempts: 1 Airway Equipment and Method: Stylet and Oral airway Placement Confirmation: ETT inserted through vocal cords under direct vision,  positive ETCO2 and breath sounds checked- equal and bilateral Tube secured with: Tape Dental Injury: Teeth and Oropharynx as per pre-operative assessment

## 2019-04-30 NOTE — Brief Op Note (Signed)
04/30/2019  11:49 AM  PATIENT:  Dawn Cannon  19 y.o. female  PRE-OPERATIVE DIAGNOSIS:  PERITONSILLAR ABSCESS  POST-OPERATIVE DIAGNOSIS:  PERITONSILLAR ABSCESS  PROCEDURE:  Procedure(s): TONSILLECTOMY (N/A)  SURGEON:  Surgeon(s) and Role:    * Drema Halon, MD - Primary  PHYSICIAN ASSISTANT:   ASSISTANTS: none   ANESTHESIA:   general  EBL:  minimal   BLOOD ADMINISTERED:none  DRAINS: none   LOCAL MEDICATIONS USED:  NONE  SPECIMEN:  No Specimen  DISPOSITION OF SPECIMEN:  N/A  COUNTS:  YES  TOURNIQUET:  * No tourniquets in log *  DICTATION: .Other Dictation: Dictation Number [id number]  PLAN OF CARE: Discharge to home after PACU  PATIENT DISPOSITION:  PACU - hemodynamically stable.   Delay start of Pharmacological VTE agent (>24hrs) due to surgical blood loss or risk of bleeding: yes

## 2019-04-30 NOTE — Discharge Instructions (Signed)
Instructions for Home Care After Tonsillectomy  First Day Home: Encourage fluid intake by frequently offering liquids, soup, ice cream jello, etc.  Drink several glasses of water.  Cooler fluids are best.  Avoid hot and highly seasoned foods.  Orange juice, grapefruit juice and tomato juice may cause stinging sensation because of their acidic content.    Second and Third Day Home: Continue liquids and add soft foods, (pudding, macaroni and cheese, mashed potatoes, soft scrambled eggs, etc.).  Make sure you drink plenty of liquids so you do not get dehydrated.  Fifth Thru Seventh Day Home: Gradually resume a normal diet, but avoid hot foods, potato chips, nuts, toast and crackers until 2 weeks after surgery.  General Instructions   No undue physical exertion or exercise for one week.  Children: Tylenol may be used for discomfort and/or fever.  Use as often as necessary within limits of the directions.  Adults: May spray throat with Chloroseptic or other topical anesthetic for discomfort and use pain medication obtained by prescription as directed.    A slight fever (up to 101) is expected for the first the first couple of days.  Take Tylenol (or aspirin substitute) as directed.  Pain in ears is common after tonsillectomy.  It represents pain referred from the throat where the tonsils were removed.  There is usually nothing wrong with the ears in most cases.  Administer Tylenol as needed to control this pain.  White patches will form where the tonsils were removed.  This is perfectly normal.  They will disappear in one to two weeks.  Mouth odor may be notice during the healing stage.  In a very small percentage of people, there is some bleeding after five to six days.  If this happens, do not become excited, for the bleeding is usually light.  Be quiet, lie down, and spit the blood out gently.  Gargle the throat with ice water.  If the bleeding does not stop promptly, call the office  [phone]), which answers 24 hours a day.  A follow up appointment should be made with Dr. Lucia Gaskins 10-14 days following surgery. Please call [phone] for the appointment time.    Tylenol, ibuprofen or hydrocodone elixir 10 - 15 cc every 6 hrs prn pain Amoxicillin susp 500 mg bid for the next week    Post Anesthesia Home Care Instructions  Activity: Get plenty of rest for the remainder of the day. A responsible individual must stay with you for 24 hours following the procedure.  For the next 24 hours, DO NOT: -Drive a car -Paediatric nurse -Drink alcoholic beverages -Take any medication unless instructed by your physician -Make any legal decisions or sign important papers.  Meals: Start with liquid foods such as gelatin or soup. Progress to regular foods as tolerated. Avoid greasy, spicy, heavy foods. If nausea and/or vomiting occur, drink only clear liquids until the nausea and/or vomiting subsides. Call your physician if vomiting continues.  Special Instructions/Symptoms: Your throat may feel dry or sore from the anesthesia or the breathing tube placed in your throat during surgery. If this causes discomfort, gargle with warm salt water. The discomfort should disappear within 24 hours.  If you had a scopolamine patch placed behind your ear for the management of post- operative nausea and/or vomiting:  1. The medication in the patch is effective for 72 hours, after which it should be removed.  Wrap patch in a tissue and discard in the trash. Wash hands thoroughly with soap and water.  2. You may remove the patch earlier than 72 hours if you experience unpleasant side effects which may include dry mouth, dizziness or visual disturbances. 3. Avoid touching the patch. Wash your hands with soap and water after contact with the patch.      No tylenol until after 5:30pm today

## 2019-04-30 NOTE — Anesthesia Preprocedure Evaluation (Addendum)
Anesthesia Evaluation  Patient identified by MRN, date of birth, ID band Patient awake    Reviewed: Allergy & Precautions, NPO status , Patient's Chart, lab work & pertinent test results  Airway Mallampati: I  TM Distance: >3 FB Neck ROM: Full    Dental no notable dental hx. (+) Teeth Intact, Dental Advisory Given   Pulmonary neg pulmonary ROS, Patient abstained from smoking., former smoker,    Pulmonary exam normal breath sounds clear to auscultation       Cardiovascular negative cardio ROS Normal cardiovascular exam Rhythm:Regular Rate:Normal     Neuro/Psych negative neurological ROS  negative psych ROS   GI/Hepatic negative GI ROS, Neg liver ROS,   Endo/Other  negative endocrine ROS  Renal/GU negative Renal ROS  negative genitourinary   Musculoskeletal negative musculoskeletal ROS (+)   Abdominal   Peds  Hematology negative hematology ROS (+)   Anesthesia Other Findings   Reproductive/Obstetrics                            Anesthesia Physical Anesthesia Plan  ASA: I  Anesthesia Plan: General   Post-op Pain Management:    Induction: Intravenous  PONV Risk Score and Plan: 3 and Midazolam, Dexamethasone and Ondansetron  Airway Management Planned: Oral ETT  Additional Equipment:   Intra-op Plan:   Post-operative Plan: Extubation in OR  Informed Consent: I have reviewed the patients History and Physical, chart, labs and discussed the procedure including the risks, benefits and alternatives for the proposed anesthesia with the patient or authorized representative who has indicated his/her understanding and acceptance.     Dental advisory given  Plan Discussed with: CRNA  Anesthesia Plan Comments:         Anesthesia Quick Evaluation

## 2019-04-30 NOTE — Interval H&P Note (Signed)
History and Physical Interval Note:  04/30/2019 10:48 AM  Dawn Cannon  has presented today for surgery, with the diagnosis of PERITONSILLAR ABSCESS.  The various methods of treatment have been discussed with the patient and family. After consideration of risks, benefits and other options for treatment, the patient has consented to  Procedure(s): TONSILLECTOMY (N/A) as a surgical intervention.  The patient's history has been reviewed, patient examined, no change in status, stable for surgery.  I have reviewed the patient's chart and labs.  Questions were answered to the patient's satisfaction.     Dillard Cannon

## 2019-05-01 NOTE — Op Note (Signed)
NAME: Dawn Cannon, Dawn Cannon MEDICAL RECORD [id number] ACCOUNT [id number] DATE OF BIRTH:[birth year] FACILITY: [facility] LOCATION: [facility] PHYSICIAN:Jaz Mallick Braxton Feathers, MD  OPERATIVE REPORT  DATE OF PROCEDURE:  04/30/2019  PREOPERATIVE DIAGNOSES:  History of recurrent peritonsillar abscess.  POSTOPERATIVE DIAGNOSES:  History of recurrent peritonsillar abscess.  OPERATION PERFORMED:  Tonsillectomy.  SURGEON:  Dillard Cannon, MD  ANESTHESIA:  General endotracheal.  COMPLICATIONS:  None.  BRIEF CLINICAL NOTE:  The patient is a 19 year old female who has had recurrent peritonsillar abscesses.  She just recently had a peritonsillar abscess drained in the ED about 2 weeks ago.  She has recovered from this and is taken to the operating room  at this time for a tonsillectomy.  DESCRIPTION OF PROCEDURE:  After adequate endotracheal anesthesia, tonsils were resected from the tonsillar fossa using cautery.  Uvula as well as anterior and posterior tonsillar pillars were preserved throughout the dissection.  The patient had a large  vessel superiorly on both sides of the tonsils that was cauterized using the cautery.  After removing tonsils and obtaining hemostasis, the oropharynx was irrigated with saline.  The patient was subsequently awoken from anesthesia and transferred to  recovery room and postop doing well.  She received 2 grams of Ancef and 10 mg of Decadron IV preoperatively.  DISPOSITION:  She was discharged home later this morning on amoxicillin suspension 500 mg b.i.d. for [redacted] week along with Tylenol, ibuprofen, and hydrocodone elixir 10-15 mL q.6 hours p.r.n. pain.  She will follow up in my office in 10-14 days for recheck.  LN/NUANCE  D:04/30/2019 T:05/01/2019 JOB:[id number]

## 2019-05-03 ENCOUNTER — Encounter: Payer: Self-pay | Admitting: *Deleted

## 2019-05-03 NOTE — Addendum Note (Signed)
Addendum  created 05/03/19 1026 by Lance Coon, CRNA   Charge Capture section accepted

## 2019-05-03 NOTE — Anesthesia Postprocedure Evaluation (Signed)
Anesthesia Post Note  Patient: Dawn Cannon  Procedure(s) Performed: TONSILLECTOMY (N/A Mouth)     Patient location during evaluation: PACU Anesthesia Type: General Level of consciousness: awake and alert Pain management: pain level controlled Vital Signs Assessment: post-procedure vital signs reviewed and stable Respiratory status: spontaneous breathing, nonlabored ventilation, respiratory function stable and patient connected to nasal cannula oxygen Cardiovascular status: blood pressure returned to baseline and stable Postop Assessment: no apparent nausea or vomiting Anesthetic complications: no    Last Vitals:  Vitals:   04/30/19 1215 04/30/19 1230  BP: (!) 135/96 (!) 136/96  Pulse: 69 72  Resp: 13 14  Temp:  36.6 C  SpO2: 100% 100%    Last Pain:  Vitals:   04/30/19 1300  TempSrc:   PainSc: 2                  George Haggart L Ethelda Deangelo

## 2019-05-24 ENCOUNTER — Ambulatory Visit (INDEPENDENT_AMBULATORY_CARE_PROVIDER_SITE_OTHER): Payer: Federal, State, Local not specified - PPO | Admitting: Otolaryngology

## 2019-05-24 ENCOUNTER — Other Ambulatory Visit: Payer: Self-pay

## 2019-05-24 VITALS — Temp 97.9°F

## 2019-05-24 DIAGNOSIS — Z4889 Encounter for other specified surgical aftercare: Secondary | ICD-10-CM

## 2019-05-24 NOTE — Progress Notes (Signed)
HPI: Dawn Cannon is a 19 y.o. female who presents 24 days s/p tonsillectomy.  She has been doing well.  No bleeding problems..   Past Medical History:  Diagnosis Date  . Abscess of tonsil    Past Surgical History:  Procedure Laterality Date  . NO PAST SURGERIES    . TONSILLECTOMY N/A 04/30/2019   Procedure: TONSILLECTOMY;  Surgeon: Drema Halon, MD;  Location: [facility];  Service: ENT;  Laterality: N/A;   Social History   Socioeconomic History  . Marital status: Single    Spouse name: Not on file  . Number of children: Not on file  . Years of education: Not on file  . Highest education level: Not on file  Occupational History  . Not on file  Tobacco Use  . Smoking status: Former Games developer  . Smokeless tobacco: Never Used  Substance and Sexual Activity  . Alcohol use: Never  . Drug use: Yes    Types: Marijuana  . Sexual activity: Not on file  Other Topics Concern  . Not on file  Social History Narrative  . Not on file   Social Determinants of Health   Financial Resource Strain:   . Difficulty of Paying Living Expenses:   Food Insecurity:   . Worried About Programme researcher, broadcasting/film/video in the Last Year:   . Barista in the Last Year:   Transportation Needs:   . Freight forwarder (Medical):   Marland Kitchen Lack of Transportation (Non-Medical):   Physical Activity:   . Days of Exercise per Week:   . Minutes of Exercise per Session:   Stress:   . Feeling of Stress :   Social Connections:   . Frequency of Communication with Friends and Family:   . Frequency of Social Gatherings with Friends and Family:   . Attends Religious Services:   . Active Member of Clubs or Organizations:   . Attends Banker Meetings:   Marland Kitchen Marital Status:    No family history on file. Allergies  Allergen Reactions  . Flour Shortness Of Breath  . Latex Itching and Rash  . Watermelon [Citrullus Vulgaris] Swelling, Rash and Other (See Comments)    No breathing  impairment (rash/swelling- face and mouth only)   Prior to Admission medications   Medication Sig Start Date End Date Taking? Authorizing Provider  amoxicillin (AMOXIL) 250 MG/5ML suspension Take 10 mLs (500 mg total) by mouth 2 (two) times daily. Patient not taking: Reported on 05/24/2019 04/30/19   Drema Halon, MD     Physical Exam: Tonsil fossa's are healed nicely bilaterally.   Assessment: S/p tonsillectomy  Plan: She will follow-up as needed   Narda Bonds, MD

## 2019-10-26 ENCOUNTER — Ambulatory Visit (INDEPENDENT_AMBULATORY_CARE_PROVIDER_SITE_OTHER): Payer: Federal, State, Local not specified - PPO | Admitting: Family Medicine

## 2019-10-26 ENCOUNTER — Encounter: Payer: Self-pay | Admitting: Family Medicine

## 2019-10-26 ENCOUNTER — Other Ambulatory Visit: Payer: Self-pay

## 2019-10-26 VITALS — BP 124/80 | HR 75 | Resp 16 | Ht 67.0 in | Wt 123.2 lb

## 2019-10-26 DIAGNOSIS — T781XXA Other adverse food reactions, not elsewhere classified, initial encounter: Secondary | ICD-10-CM | POA: Insufficient documentation

## 2019-10-26 DIAGNOSIS — T7800XA Anaphylactic reaction due to unspecified food, initial encounter: Secondary | ICD-10-CM | POA: Diagnosis not present

## 2019-10-26 DIAGNOSIS — T781XXD Other adverse food reactions, not elsewhere classified, subsequent encounter: Secondary | ICD-10-CM | POA: Diagnosis not present

## 2019-10-26 DIAGNOSIS — Z91040 Latex allergy status: Secondary | ICD-10-CM | POA: Insufficient documentation

## 2019-10-26 DIAGNOSIS — J4520 Mild intermittent asthma, uncomplicated: Secondary | ICD-10-CM

## 2019-10-26 DIAGNOSIS — J301 Allergic rhinitis due to pollen: Secondary | ICD-10-CM

## 2019-10-26 MED ORDER — ALBUTEROL SULFATE HFA 108 (90 BASE) MCG/ACT IN AERS: INHALATION_SPRAY | RESPIRATORY_TRACT | 1 refills | Status: AC

## 2019-10-26 MED ORDER — AUVI-Q 0.3 MG/0.3ML IJ SOAJ: INTRAMUSCULAR | 3 refills | Status: AC

## 2019-10-26 NOTE — Progress Notes (Signed)
New Patient Note  RE: Dawn Cannon MRN: [id number] DOB: December 29, 2000 Date of Office Visit: 10/26/2019  Referring provider: Casper Harrison, NP Primary care provider: Casper Harrison, NP  Chief Complaint: Shortness of breath  History of present illness: Dawn Cannon is a 19 y.o. female presenting today for consultation for possible food allergies and shortness of breath. She is accompanied by her mother who assists with history. 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 In October 2020 she went to the emergency department for evaluation of a sore throat and throat swelling where she was diagnosed with tonsillitis and received amoxicillin. She reports that in November 2020 she developed a cough which was occasionally dry and occasionally producing clear mucus. She received antibiotics for tonsillitis again in late January and then in February she was diagnosed by CT scan of the neck with a tonsillar abscess. She followed up with Dr. Ezzard Standing, ENT, with tonsillectomy on 04/19/2019. She reports that, since that time, she has continued to experience shortness of breath and occasional cough. She has since left the job working with flour at [employer] and she reports she continues to experience shortness of breath with activity and at rest, however, the cough is beginning to resolve.  She is active in competitive cheer with occasional shortness of breath occurring with this activity.  She has not used an inhaler prior to today's visit. She denies symptoms of reflux including heartburn and vomiting. She denies symptoms of seasonal allergies including nasal congestion, rhinorrhea, sneezing, post nasal drainage, or occular symptoms. She reports that, several years ago, after eating watermelon, she developed a raised, red, itchy rash around her mouth, mouth and tongue itching, and throat swelling that occurred  only minutes after exposure to watermelon and resolved  with no medical intervention. She reports she rinsed her mouth with water and symptoms resolved within an hour. She again tried watermelon a few years later with the same symptoms occurring. She denies concomitant cardiovascular or gastrointestinal symptoms with watermelon exposure. She reports mouth tingling only when eating pineapple and banana. She reports hand pruritis and red, bumpy, rash after wearing latex gloves. She denies concomitant cardiopulmonary or gastrointestinal symptoms after latex exposure. Her current medications are listed in the chart.    Review of systems: Review of Systems  Constitutional: Negative.   HENT:       Frequent tonsillitis with tonsillectomy  Eyes: Negative.   Respiratory:       Cough some dry and some producing clear mucus. Shortness of breath with activity and rest  Cardiovascular: Negative.   Gastrointestinal: Negative for diarrhea, heartburn and nausea.  Musculoskeletal: Negative.   Skin:       Papular urticaria around mouth after eating watermelon  Endo/Heme/Allergies: Positive for environmental allergies.    All other systems negative unless noted above in HPI  Past medical history: Past Medical History:  Diagnosis Date  . Abscess of tonsil     Past surgical history: Past Surgical History:  Procedure Laterality Date  . NO PAST SURGERIES    . TONSILLECTOMY N/A 04/30/2019   Procedure: TONSILLECTOMY;  Surgeon: Drema Halon, MD;  Location: [facility];  Service: ENT;  Laterality: N/A;  . TONSILLECTOMY      Family history:  Family History  Problem Relation Age of Onset  . Hypertension Maternal Grandmother   . Hypertension Maternal  Grandfather     Social history: Social History   Socioeconomic History  . Marital status: Single    Spouse name: Not on file  . Number of children: Not on file  . Years of education: Not on file  . Highest education level: Not  on file  Occupational History  . Not on file  Tobacco Use  . Smoking status: Former Games developer  . Smokeless tobacco: Never Used  Vaping Use  . Vaping Use: Never used  Substance and Sexual Activity  . Alcohol use: Never  . Drug use: Yes    Types: Marijuana  . Sexual activity: Not on file  Other Topics Concern  . Not on file  Social History Narrative  . Not on file   Social Determinants of Health   Financial Resource Strain:   . Difficulty of Paying Living Expenses:   Food Insecurity:   . Worried About Programme researcher, broadcasting/film/video in the Last Year:   . Barista in the Last Year:   Transportation Needs:   . Freight forwarder (Medical):   Marland Kitchen Lack of Transportation (Non-Medical):   Physical Activity:   . Days of Exercise per Week:   . Minutes of Exercise per Session:   Stress:   . Feeling of Stress :   Social Connections:   . Frequency of Communication with Friends and Family:   . Frequency of Social Gatherings with Friends and Family:   . Attends Religious Services:   . Active Member of Clubs or Organizations:   . Attends Banker Meetings:   Marland Kitchen Marital Status:   Intimate Partner Violence:   . Fear of Current or Ex-Partner:   . Emotionally Abused:   Marland Kitchen Physically Abused:   . Sexually Abused:    Environmental history: Patient lives in a home of unlisted age.  There is no concern for water damage or mildew in the home.  Flooring is wet throughout.  Heating is electric and cooling is central.  There is a cat located inside the house as well as inside the bedroom.  There is no concern for roaches in the home and the bed is at least 2 feet off the floor.  There are dust mite free covers in use on the bed and pillows.  There is no concern for tobacco smoke in the home or in the car.  She is not currently smoking or vaping.  Medication List: Current Outpatient Medications  Medication Sig Dispense Refill  . albuterol (VENTOLIN HFA) 108 (90 Base) MCG/ACT inhaler Can  inhale two puffs every four to six hours as needed for cough or wheeze. 8.5 g 1  . AUVI-Q 0.3 MG/0.3ML SOAJ injection Use as directed for life-threatening allergic reaction. 2 each 3   No current facility-administered medications for this visit.    Known medication allergies: Allergies  Allergen Reactions  . Flour Shortness Of Breath  . Latex Itching and Rash  . Watermelon [Citrullus Vulgaris] Swelling, Rash and Other (See Comments)    No breathing impairment (rash/swelling- face and mouth only)     Physical examination: Blood pressure 124/80, pulse 75, resp. rate 16, height 5\' 7"  (1.702 m), weight 123 lb 3.2 oz (55.9 kg), SpO2 98 %.  General: Alert, interactive, in no acute distress. HEENT: TMs pearly gray, turbinates non-edematous without discharge, post-pharynx mildly erythematous. Neck: Supple without lymphadenopathy. Lungs: Clear to auscultation without wheezing, rhonchi or rales. {no increased work of breathing. CV: Normal S1, S2 without murmurs. Abdomen: Nondistended,  nontender. Skin: Warm and dry, without lesions or rashes. Extremities:  No clubbing, cyanosis or edema. Neuro:   Grossly intact.  Diagnositics/Labs: Labs: Labs ordered include barley, bakers yeast, banana, pineapple, rye, oat, watermelon, and wheat.  Spirometry: FEV1: 4.01, FVC: 4.80, ratio consistent with normal ventilatory function  Allergy testing: Percutaneous environmental skin testing was positive to grass pollen, weed pollen, tree pollen, and equivocal to cat with adequate controls.   Select foods were positive to wheat, barley, oat, saccharomyces cerevisiae, and watermelon with adequate controls.   At this time, the patient and her mother chose not to proceed with intradermal testing.   Allergy testing results were read and interpreted by provider, documented by clinical staff.   Assessment and plan: Patient Instructions  Asthma Albuterol 2 puffs once every 4 hours as needed for cough or  wheeze You may use albuterol 2 puffs 5-15 minutes before exercise to decrease cough or wheeze Call the clinic if you are using the albuterol inhaler more than twice a week Asthma control goals:   Full participation in all desired activities (may need albuterol before activity)  Albuterol use two time or less a week on average (not counting use with activity)  Cough interfering with sleep two time or less a month  Oral steroids no more than once a year  No hospitalizations  Allergic rhinitis Your environmental skin testing was positive to pollens and equivocal to cat Allergen avoidance measures are listed below Begin cetirizine 10 mg once a day as needed for a runny nose Begin Flonase 2 sprays in each nostril once a day as needed for a stuffy nose Consider saline nasal rinses as needed for nasal symptoms. Use this before any medicated nasal sprays for best result  Food allergy Your skin testing was positive to watermelon, wheat, oat, barley, and yeast. Avoid watermelon, wheat, oat, barley, and yeast.  In case of an allergic reaction, take Benadryl 50 mg every 6 hours, and if life-threatening symptoms occur, inject with AuviQ 0.3 mg.  Oral allergy syndrome Avoid the foods that irritate your mouth. You may find these foods bother your mouth less if peeled or cooked.  In case of an allergic reaction, take Benadryl 50 mg every 6 hours, and if life-threatening symptoms occur, inject with AuviQ 0.3 mg.  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   Call the clinic if this treatment plan is not working well for you  Follow up in 2 months or sooner if needed.   Return in about 2 months (around 12/26/2019), or if symptoms worsen or fail to improve.  I appreciate the opportunity to take part in Dawn Cannon's care. Please do not hesitate to contact me with questions.  Sincerely,  Thermon Leyland, FNP [facility]

## 2019-10-26 NOTE — Patient Instructions (Addendum)
Asthma Albuterol 2 puffs once every 4 hours as needed for cough or wheeze You may use albuterol 2 puffs 5-15 minutes before exercise to decrease cough or wheeze Call the clinic if you are using the albuterol inhaler more than twice a week Asthma control goals:   Full participation in all desired activities (may need albuterol before activity)  Albuterol use two time or less a week on average (not counting use with activity)  Cough interfering with sleep two time or less a month  Oral steroids no more than once a year  No hospitalizations  Allergic rhinitis Your environmental skin testing was positive to pollens and equivocal to cat Allergen avoidance measures are listed below Begin cetirizine 10 mg once a day as needed for a runny nose Begin Flonase 2 sprays in each nostril once a day as needed for a stuffy nose Consider saline nasal rinses as needed for nasal symptoms. Use this before any medicated nasal sprays for best result  Food allergy Your skin testing was positive to watermelon, wheat, oat, barley, and yeast. Avoid watermelon, wheat, oat, barley, and yeast.  In case of an allergic reaction, take Benadryl 50 mg every 6 hours, and if life-threatening symptoms occur, inject with AuviQ 0.3 mg.  Oral allergy syndrome Avoid the foods that irritate your mouth. You may find these foods bother your mouth less if peeled or cooked.  In case of an allergic reaction, take Benadryl 50 mg every 6 hours, and if life-threatening symptoms occur, inject with AuviQ 0.3 mg.  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  If you are interested in latex allergy testing in the future, call the clinic for an appointment. Remember to stop antihistamines for 3 days before the testing appointment.  Call the clinic if this treatment plan is not working well for you  Follow up in 2 months or sooner if needed.

## 2020-10-31 IMAGING — CT CT NECK W/ CM
5 of 6 series · 14 of 33 positions shown, 16 images · IV contrast (APPLIED)
Comparison: None.

CLINICAL DATA: Tonsillitis. The patient reports a sensation of her
throat closing. Leukocytosis.

EXAM:
CT NECK WITH CONTRAST
TECHNIQUE: Multidetector CT imaging of the neck was performed using the
standard protocol following the bolus administration of intravenous
contrast.
CONTRAST:  75mL OMNIPAQUE IOHEXOL 300 MG/ML  SOLN

[Series 4: axial neck · axial · 0.48mm/px · z∈[+1038,+1118]mm · 2 of 122 slices shown, 3 images]
[im 41/122  soft-tissue]
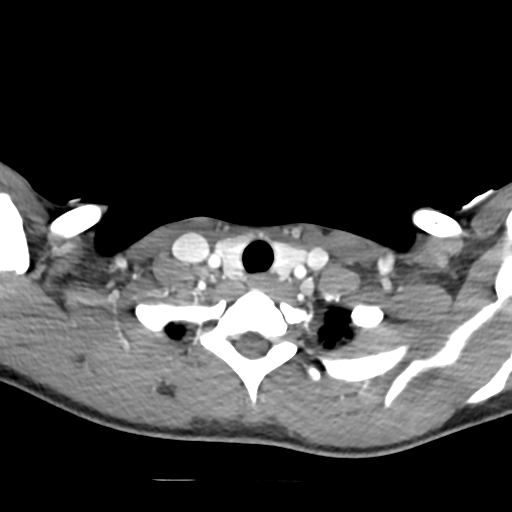
[im 41/122  bone]
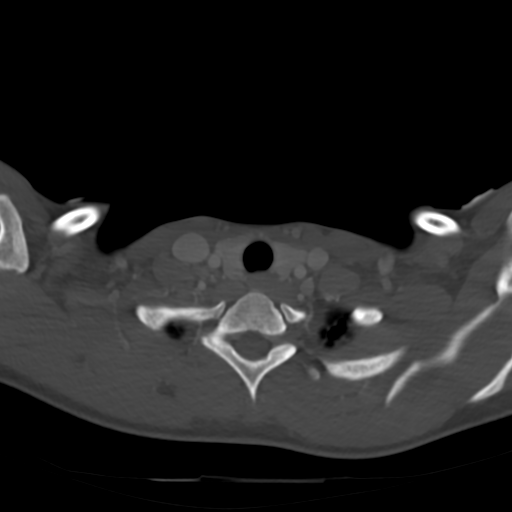
[im 81/122  bone]
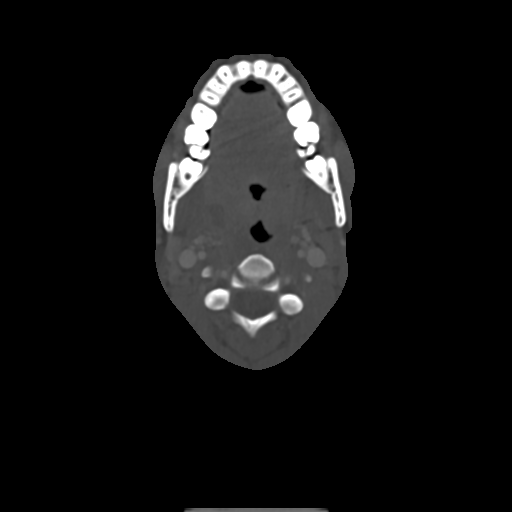

[Series 6: axial bone · axial · 0.48mm/px · z∈[+1038,+1118]mm · 2 of 122 slices shown]
[im 41/122  bone]
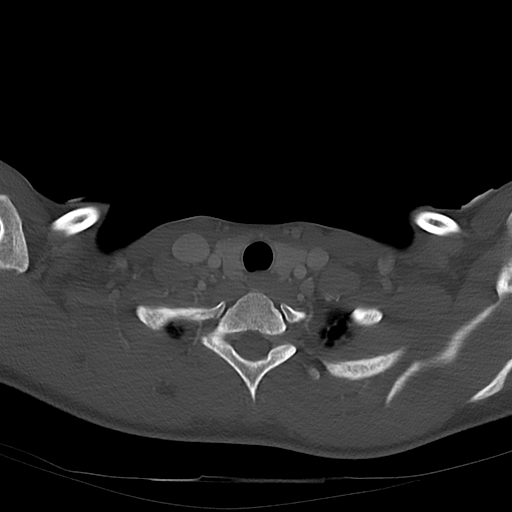
[im 81/122  bone]
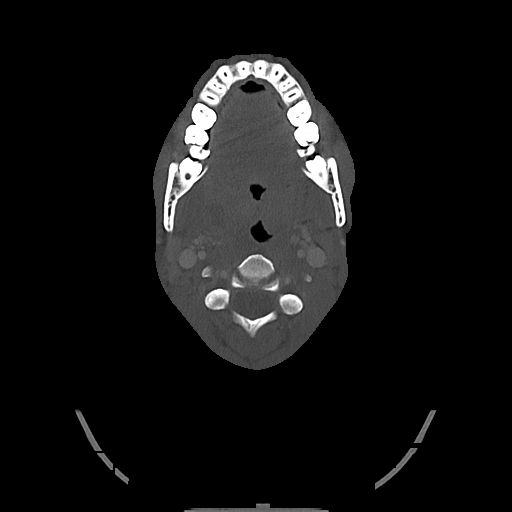

[Series 7: sag neck · sagittal · 0.46mm/px · 5 of 122 slices shown, 6 images]
[im 41/122  bone]
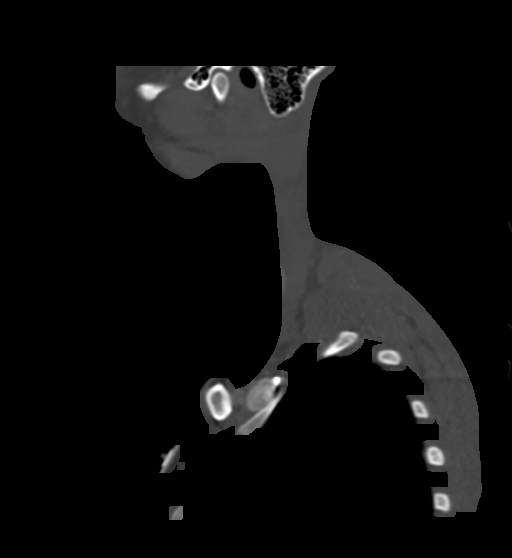
[im 51/122  bone]
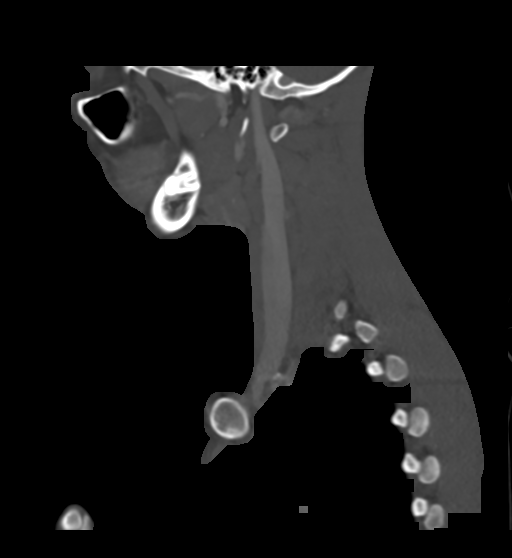
[im 61/122  soft-tissue]
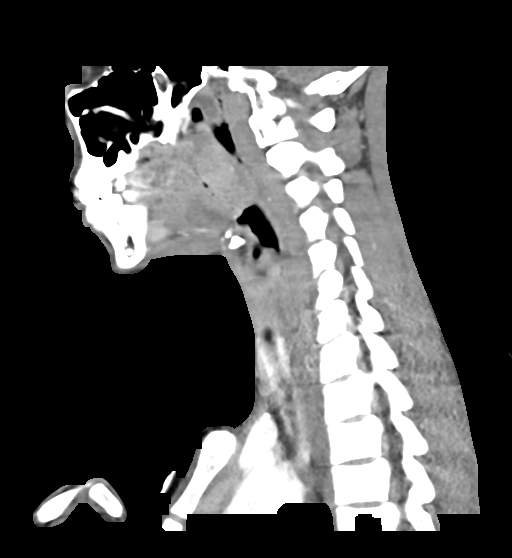
[im 61/122  bone]
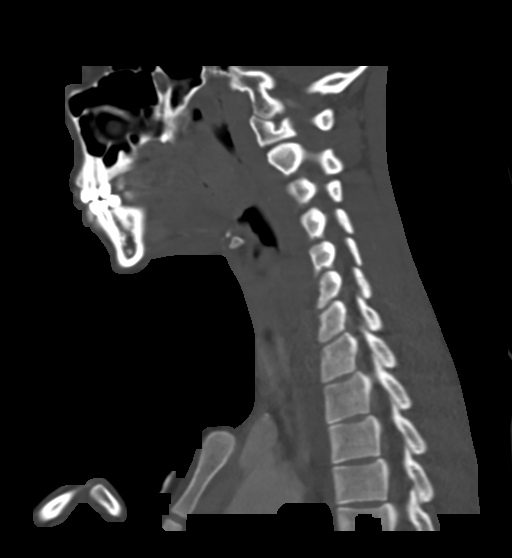
[im 71/122  bone]
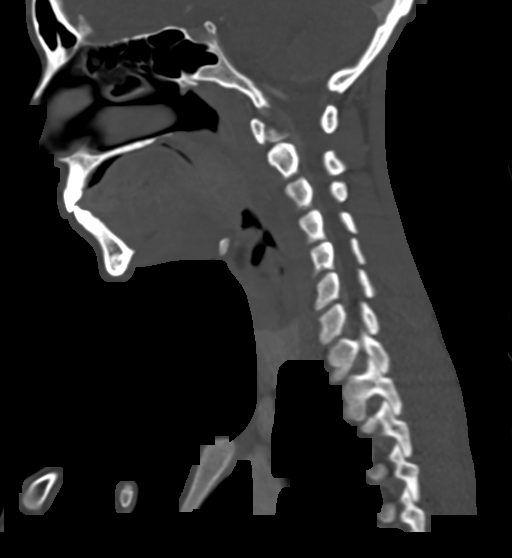
[im 81/122  bone]
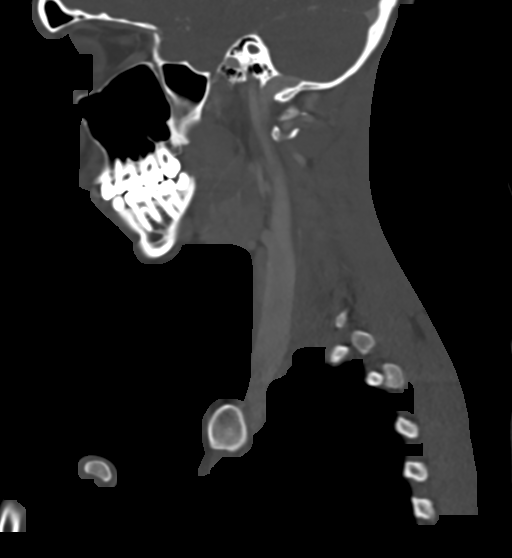

[Series 8: cor neck · coronal · 0.50mm/px · 3 of 117 slices shown]
[im 24/117  bone]
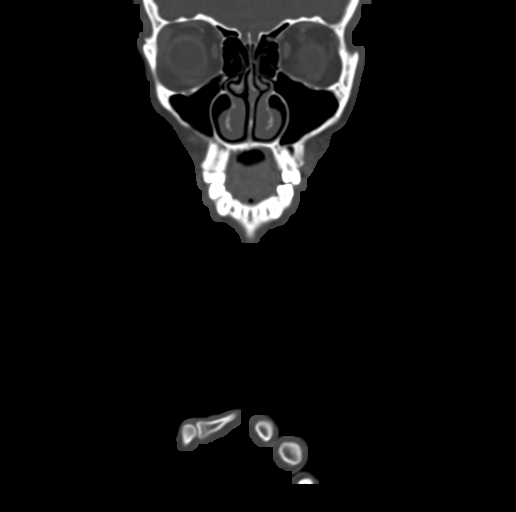
[im 47/117  bone]
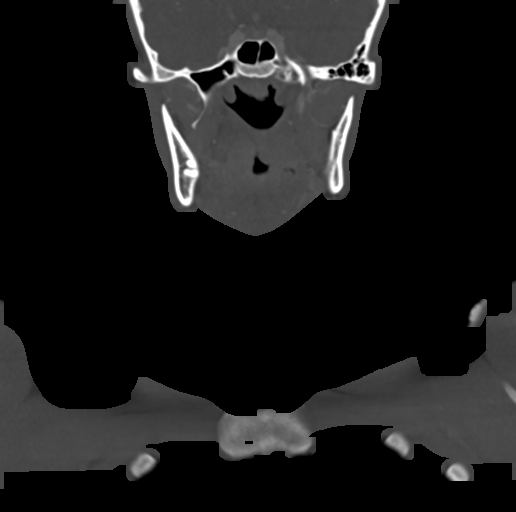
[im 70/117  bone]
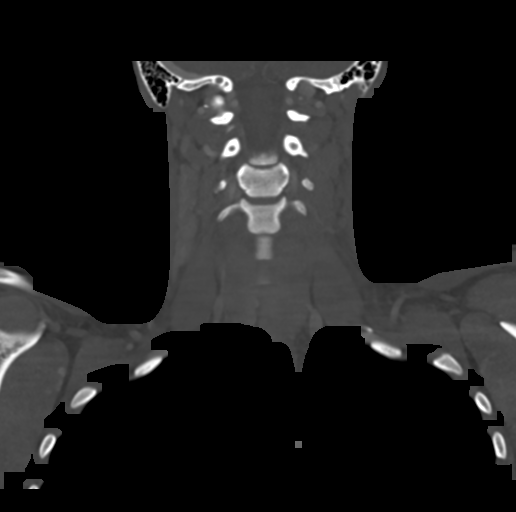

[Series 9: ax oropharynx · axial · 0.45mm/px · z∈[+1037,+1115]mm · 2 of 119 slices shown]
[im 40/119  bone]
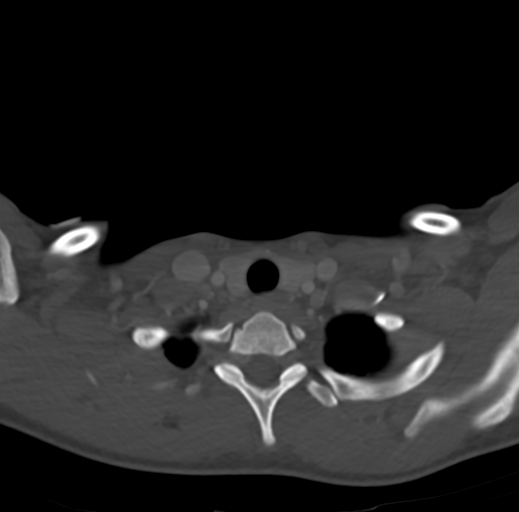
[im 79/119  bone]
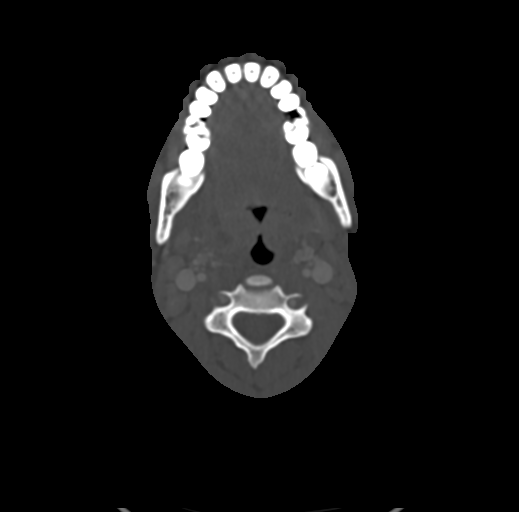

[14 of 33 positions shown; findings below may reference images not displayed]

FINDINGS: Pharynx and larynx: There is asymmetric enlargement of the right
palatine tonsil, and there is an associated peritonsillar fluid
collection measuring 13 x 8 x 10 mm. Mild submucosal edema extends
inferiorly. The airway remains patent. There is no retropharyngeal
fluid collection.

Salivary glands: No inflammation, mass, or stone.

Thyroid: Unremarkable.

Lymph nodes: No enlarged or suspicious lymph nodes in the neck.

Vascular: Major vascular structures of the neck are patent.

Limited intracranial: Unremarkable.

Visualized orbits: Unremarkable.

Mastoids and visualized paranasal sinuses: Clear.

Skeleton: No acute osseous abnormality or suspicious osseous lesion.

Upper chest: Clear lung apices.

Other: None.
IMPRESSION: Acute tonsillitis with 13 mm right peritonsillar abscess.

## 2023-12-08 NOTE — Telephone Encounter (Signed)
 error
